# Patient Record
Sex: Female | Born: 1960 | Race: Black or African American | Hispanic: No | Marital: Married | Smoking: Never smoker
Health system: Southern US, Community
[De-identification: ages and names within clinical notes are randomized; demographics above are authoritative.]

## PROBLEM LIST (undated history)

## (undated) DIAGNOSIS — J45909 Unspecified asthma, uncomplicated: Secondary | ICD-10-CM

## (undated) DIAGNOSIS — I10 Essential (primary) hypertension: Secondary | ICD-10-CM

---

## 2010-01-31 DEATH — deceased

## 2021-03-27 ENCOUNTER — Encounter: Payer: Self-pay | Admitting: Emergency Medicine

## 2021-03-27 ENCOUNTER — Ambulatory Visit (INDEPENDENT_AMBULATORY_CARE_PROVIDER_SITE_OTHER): Payer: 59

## 2021-03-27 ENCOUNTER — Other Ambulatory Visit: Payer: Self-pay

## 2021-03-27 ENCOUNTER — Ambulatory Visit
Admission: EM | Admit: 2021-03-27 | Discharge: 2021-03-27 | Disposition: A | Discharge status: Home / Self Care | Payer: 59

## 2021-03-27 DIAGNOSIS — R059 Cough, unspecified: Secondary | ICD-10-CM

## 2021-03-27 DIAGNOSIS — B349 Viral infection, unspecified: Secondary | ICD-10-CM | POA: Diagnosis not present

## 2021-03-27 DIAGNOSIS — J4540 Moderate persistent asthma, uncomplicated: Secondary | ICD-10-CM

## 2021-03-27 DIAGNOSIS — R062 Wheezing: Secondary | ICD-10-CM

## 2021-03-27 DIAGNOSIS — R051 Acute cough: Secondary | ICD-10-CM

## 2021-03-27 HISTORY — DX: Essential (primary) hypertension: I10

## 2021-03-27 HISTORY — DX: Unspecified asthma, uncomplicated: J45.909

## 2021-03-27 MED ORDER — LEVOCETIRIZINE DIHYDROCHLORIDE 5 MG PO TABS: 5.0000 mg | ORAL_TABLET | Freq: Every evening | ORAL | 0 refills | Status: AC

## 2021-03-27 NOTE — ED Provider Notes (Signed)
[facility]   MRN: [id number] DOB: [birth year]  Subjective:   Julie Gay is a 60 y.o. female presenting for 5-day history of acute onset persistent coughing, congestion, wheezing.  Patient did a virtual visit as she was worried that her albuterol was acting up, was prescribed azithromycin, prednisone and a cough syrup.  Reports that this has not helped her.  She is not opposed to getting COVID and flu testing.  Denies history of diabetes.  No current facility-administered medications for this encounter.  Current Outpatient Medications:    albuterol (VENTOLIN HFA) 108 (90 Base) MCG/ACT inhaler, Inhale into the lungs every 6 (six) hours as needed for wheezing or shortness of breath., Disp: , Rfl:    atorvastatin (LIPITOR) 40 MG tablet, Take 40 mg by mouth daily., Disp: , Rfl:    fluticasone-salmeterol (WIXELA INHUB) 500-50 MCG/ACT AEPB, Inhale 1 puff into the lungs in the morning and at bedtime., Disp: , Rfl:    hydrochlorothiazide (HYDRODIURIL) 25 MG tablet, Take 25 mg by mouth daily., Disp: , Rfl:    lisinopril (ZESTRIL) 40 MG tablet, Take 40 mg by mouth daily., Disp: , Rfl:    pantoprazole (PROTONIX) 20 MG tablet, Take 40 mg by mouth daily., Disp: , Rfl:    No Known Allergies  Past Medical History:  Diagnosis Date   Asthma    Hypertension      History reviewed. No pertinent surgical history.  No family history on file.  Social History   Tobacco Use   Smoking status: Never   Smokeless tobacco: Never  Substance Use Topics   Alcohol use: Not Currently    ROS   Objective:   Vitals: BP 133/69   Pulse 61   Temp 98.7 F (37.1 C) (Oral)   Resp 18   SpO2 97%   Physical Exam Constitutional:      General: She is not in acute distress.    Appearance: Normal appearance. She is well-developed. She is not ill-appearing, toxic-appearing or diaphoretic.  HENT:     Head: Normocephalic and atraumatic.     Nose: Nose normal.     Mouth/Throat:     Mouth:  Mucous membranes are moist.  Eyes:     General: No scleral icterus.       Right eye: No discharge.        Left eye: No discharge.     Extraocular Movements: Extraocular movements intact.     Conjunctiva/sclera: Conjunctivae normal.     Pupils: Pupils are equal, round, and reactive to light.  Cardiovascular:     Rate and Rhythm: Normal rate and regular rhythm.     Pulses: Normal pulses.     Heart sounds: Normal heart sounds. No murmur heard.   No friction rub. No gallop.  Pulmonary:     Effort: Pulmonary effort is normal. No respiratory distress.     Breath sounds: No stridor. Rhonchi (right sided) present. No wheezing or rales.  Skin:    General: Skin is warm and dry.     Findings: No rash.  Neurological:     General: No focal deficit present.     Mental Status: She is alert and oriented to person, place, and time.  Psychiatric:        Mood and Affect: Mood normal.        Behavior: Behavior normal.        Thought Content: Thought content normal.    DG Chest 2 View  Result Date: 03/27/2021 CLINICAL DATA:  Cough wheezing EXAM: CHEST - 2 VIEW COMPARISON:  None. FINDINGS: The heart size and mediastinal contours are within normal limits. Both lungs are clear. Degenerative changes of the spine. IMPRESSION: No active cardiopulmonary disease. Electronically Signed   By: Jasmine Pang M.D.   On: 03/27/2021 18:59     Assessment and Plan :   PDMP not reviewed this encounter.  1. Acute viral syndrome   2. Acute cough   3. Moderate persistent asthma without complication     Recommended she continue with the regimen prescribed including prednisone, cough syrup, albuterol.  We will add Xyzal to her regimen.  COVID and flu test pending.  Counseled patient on potential for adverse effects with medications prescribed/recommended today, ER and return-to-clinic precautions discussed, patient verbalized understanding.    Wallis Bamberg, PA-C 03/28/21 1003

## 2021-03-27 NOTE — ED Triage Notes (Signed)
PT reports cough and congestion started Monday, causing asthma flare up. PT did a virtual visit and has been taking azithromycin, prednison, and promethazine DM since Tuesday. These have not helped.

## 2021-03-29 LAB — COVID-19, FLU A+B NAA
Influenza A, NAA: DETECTED — AB
Influenza B, NAA: NOT DETECTED
SARS-CoV-2, NAA: NOT DETECTED

## 2022-10-26 IMAGING — DX DG CHEST 2V
2 series · 2 of 2 positions shown · non-contrast
Comparison: None.

CLINICAL DATA: Cough wheezing

EXAM:
CHEST - 2 VIEW

[chest pa]
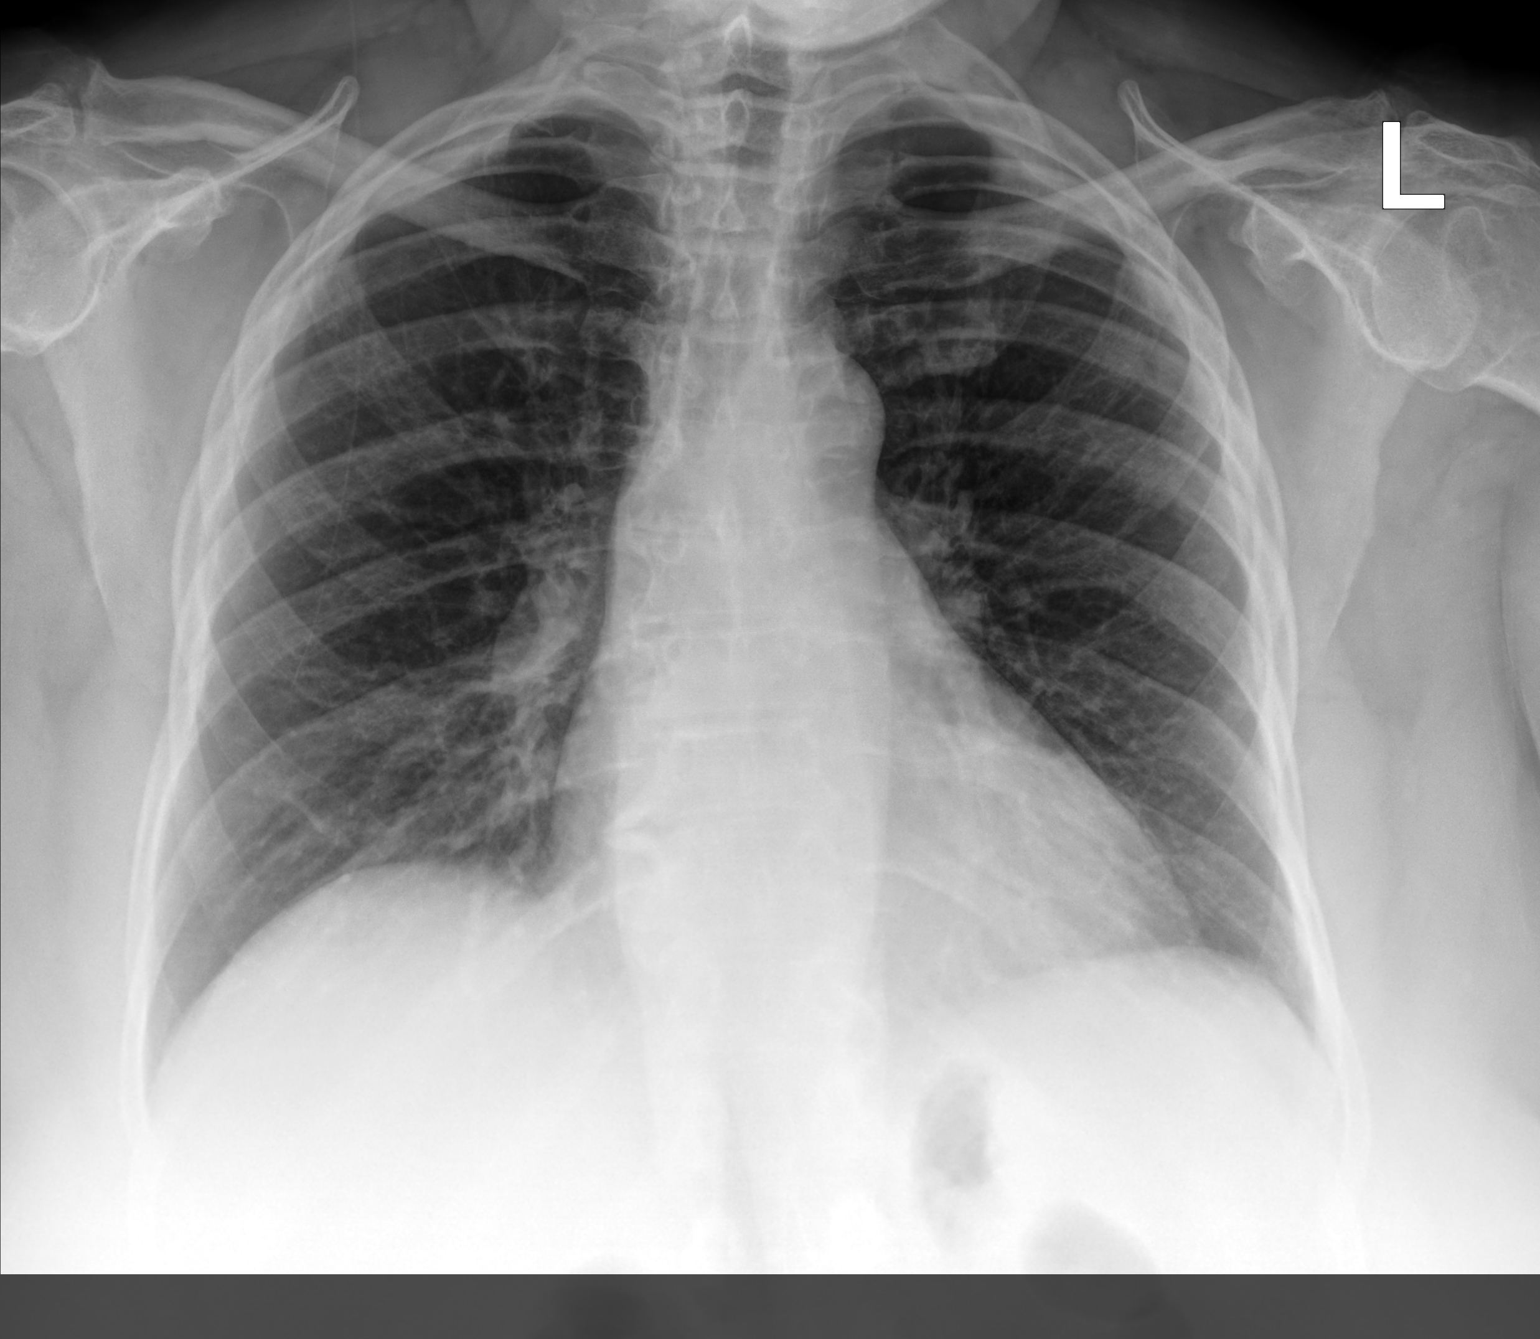

[chest lat]
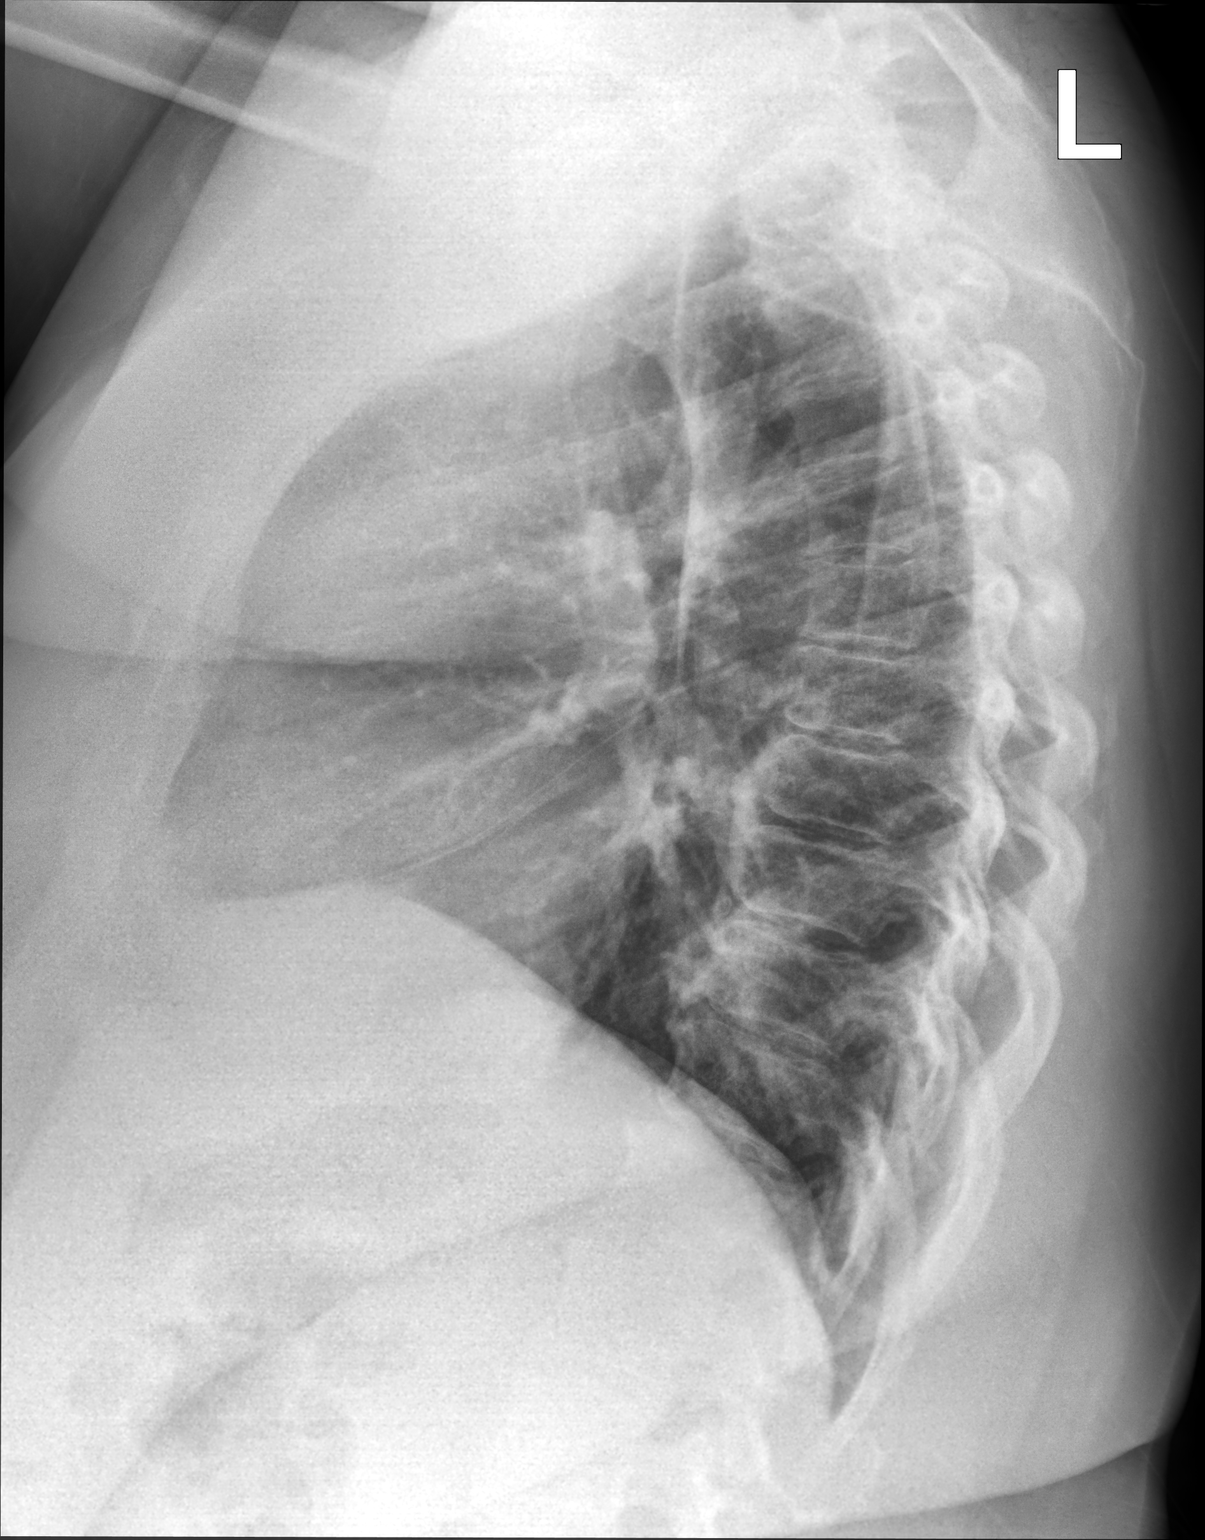

[2 of 2 positions shown; findings below may reference images not displayed]

FINDINGS: The heart size and mediastinal contours are within normal limits.
Both lungs are clear. Degenerative changes of the spine.
IMPRESSION: No active cardiopulmonary disease.
